# Patient Record
Sex: Male | Born: 1990 | Race: Black or African American | Hispanic: No | Marital: Single | Smoking: Never smoker
Health system: Southern US, Community
[De-identification: ages and names within clinical notes are randomized; demographics above are authoritative.]

## PROBLEM LIST (undated history)

## (undated) ENCOUNTER — Emergency Department (HOSPITAL_COMMUNITY): Payer: Managed Care, Other (non HMO)

## (undated) DIAGNOSIS — R55 Syncope and collapse: Secondary | ICD-10-CM

## (undated) DIAGNOSIS — R9431 Abnormal electrocardiogram [ECG] [EKG]: Secondary | ICD-10-CM

## (undated) HISTORY — DX: Abnormal electrocardiogram (ECG) (EKG): R94.31

## (undated) HISTORY — DX: Syncope and collapse: R55

---
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
## 2013-01-31 ENCOUNTER — Encounter: Payer: Self-pay | Admitting: *Deleted

## 2013-02-01 ENCOUNTER — Encounter: Payer: Self-pay | Admitting: Cardiovascular Disease

## 2013-02-01 ENCOUNTER — Ambulatory Visit (INDEPENDENT_AMBULATORY_CARE_PROVIDER_SITE_OTHER): Payer: Federal, State, Local not specified - PPO | Admitting: Cardiovascular Disease

## 2013-02-01 VITALS — BP 116/68 | HR 58 | Ht 75.0 in | Wt 191.0 lb

## 2013-02-01 DIAGNOSIS — R55 Syncope and collapse: Secondary | ICD-10-CM

## 2013-02-01 DIAGNOSIS — R079 Chest pain, unspecified: Secondary | ICD-10-CM | POA: Insufficient documentation

## 2013-02-01 DIAGNOSIS — R9431 Abnormal electrocardiogram [ECG] [EKG]: Secondary | ICD-10-CM | POA: Insufficient documentation

## 2013-02-01 NOTE — Assessment & Plan Note (Signed)
Somewhat concerning to have young athlete with chest pain and pre syncope playing basketball.  See above regarding need for echo [facility] cardiac CT to r/o coronary anomaly as his presyncope was associated with both dyspnea and chest pain.

## 2013-02-01 NOTE — Assessment & Plan Note (Signed)
Etiology of symptoms not clear  No high risk family history ECG ok and exam ok  F/U echo to r/o HOCM, or RV abnormality

## 2013-02-01 NOTE — Patient Instructions (Addendum)
Your physician has requested that you have an echocardiogram. Echocardiography is a painless test that uses sound waves to create images of your heart. It provides your doctor with information about the size and shape of your heart and how well your heart's chambers and valves are working. This procedure takes approximately one hour. There are no restrictions for this procedure.  Your physician has requested that you have cardiac CTA. Cardiac computed tomography (CT) is a painless test that uses an x-ray machine to take clear, detailed pictures of your heart. For further information please visit [email or web address]. Please follow instruction sheet as given.

## 2013-02-01 NOTE — Progress Notes (Signed)
Patient ID: Troy Li, male   DOB: June 09, 1990, 23 y.o.   MRN: [id number]   23 yo referred by Dr Kenna GilbertMorrow Eagle for chest pain and presyncope.  He is a black male who attends [employer].  Studying business.  Plays a lot of basketball  First episode last Summer.  Similar episode earlier this month.  Near faint with SSCP.  Was tired.  Had chest pain while playing and had to sit down.  Had not eaten and played for a long time Some palpitations.  Got water and some fruit with slow recovery.  Also felt hard to get air.  No incidence other than on basketball court.  Only child Mom and dad with no cardiac Issues or sudden death in extended family.  No history of hypercoagulability DVT, PE.  Denies ETOH, drugs, cigarettes or stimulants.  Mother says " he is a good kid"      ROS: Denies fever, malais, weight loss, blurry vision, decreased visual acuity, cough, sputum, SOB, hemoptysis, pleuritic pain, palpitaitons, heartburn, abdominal pain, melena, lower extremity edema, claudication, or rash.  All other systems reviewed and negative   General: Affect appropriate Well developed black male  HEENT: normal Neck supple with no adenopathy JVP normal no bruits no thyromegaly Lungs clear with no wheezing and good diaphragmatic motion Heart:  S1/S2 no murmur,rub, gallop or click PMI normal Abdomen: benighn, BS positve, no tenderness, no AAA no bruit.  No HSM or HJR Distal pulses intact with no bruits No edema Neuro non-focal Skin warm and dry No muscular weakness  Medications No current outpatient prescriptions on file.   No current facility-administered medications for this visit.    Allergies Penicillins  Family History: Family History  Problem Relation Age of Onset  . Fibromyalgia Mother   . Alzheimer's disease Maternal Grandfather     Social History: History   Social History  . Marital Status: Single    Spouse Name: N/A    Number of Children: N/A  . Years of Education: N/A    Occupational History  . Not on file.   Social History Main Topics  . Smoking status: Never Smoker   . Smokeless tobacco: Never Used  . Alcohol Use: No  . Drug Use: No  . Sexual Activity: Not on file   Other Topics Concern  . Not on file   Social History Narrative  . No narrative on file    Electrocardiogram:  SR rate 45 QTc 399 normal QRS and morphology no LVH and no pre excitation  Assessment and Plan

## 2013-02-14 ENCOUNTER — Encounter: Payer: Self-pay | Admitting: Cardiovascular Disease

## 2013-02-21 ENCOUNTER — Ambulatory Visit (HOSPITAL_COMMUNITY): Admission: RE | Admit: 2013-02-21 | Payer: Federal, State, Local not specified - PPO | Source: Ambulatory Visit

## 2013-02-23 ENCOUNTER — Other Ambulatory Visit (HOSPITAL_COMMUNITY): Payer: Federal, State, Local not specified - PPO

## 2013-03-10 ENCOUNTER — Telehealth: Payer: Self-pay | Admitting: Hematology and Oncology

## 2013-03-10 NOTE — Telephone Encounter (Signed)
S/W PTS MOTHER IN REF TO NP APPT 03/23/13@8 :00 REFERRING DR MORROW DX-LEUKOPENIA, NEUTROPENIA MAILED NP PACKET

## 2013-03-11 ENCOUNTER — Telehealth: Payer: Self-pay | Admitting: Hematology and Oncology

## 2013-03-11 NOTE — Telephone Encounter (Signed)
C/D 03/11/13 for appt. 03/23/13

## 2013-03-14 ENCOUNTER — Telehealth: Payer: Self-pay | Admitting: Hematology and Oncology

## 2013-03-14 NOTE — Telephone Encounter (Signed)
due to new schedule block for 3/18 appt moved to PM. new pt appt moved to 1:30pm FC/NP on 3/18. s/w pt he is aware.

## 2013-03-17 ENCOUNTER — Other Ambulatory Visit (HOSPITAL_COMMUNITY): Payer: Federal, State, Local not specified - PPO

## 2013-03-23 ENCOUNTER — Ambulatory Visit: Payer: Federal, State, Local not specified - PPO | Admitting: Hematology and Oncology

## 2013-03-23 ENCOUNTER — Ambulatory Visit: Payer: Federal, State, Local not specified - PPO

## 2013-03-23 ENCOUNTER — Ambulatory Visit (HOSPITAL_COMMUNITY): Payer: Federal, State, Local not specified - PPO | Attending: Cardiovascular Disease | Admitting: Radiology

## 2013-03-23 DIAGNOSIS — R9431 Abnormal electrocardiogram [ECG] [EKG]: Secondary | ICD-10-CM | POA: Insufficient documentation

## 2013-03-23 DIAGNOSIS — R072 Precordial pain: Secondary | ICD-10-CM | POA: Insufficient documentation

## 2013-03-23 DIAGNOSIS — R55 Syncope and collapse: Secondary | ICD-10-CM | POA: Insufficient documentation

## 2013-03-23 NOTE — Progress Notes (Signed)
Echocardiogram performed.  

## 2013-03-31 ENCOUNTER — Other Ambulatory Visit (HOSPITAL_COMMUNITY): Payer: Federal, State, Local not specified - PPO

## 2013-04-04 ENCOUNTER — Encounter: Payer: Self-pay | Admitting: Cardiovascular Disease

## 2013-04-04 ENCOUNTER — Other Ambulatory Visit: Payer: Self-pay | Admitting: *Deleted

## 2013-04-04 DIAGNOSIS — R001 Bradycardia, unspecified: Secondary | ICD-10-CM

## 2013-04-05 ENCOUNTER — Encounter: Payer: Self-pay | Admitting: Cardiovascular Disease

## 2013-04-20 ENCOUNTER — Telehealth (HOSPITAL_COMMUNITY): Payer: Self-pay

## 2013-04-22 ENCOUNTER — Ambulatory Visit (HOSPITAL_COMMUNITY): Payer: Federal, State, Local not specified - PPO

## 2013-04-22 ENCOUNTER — Encounter (HOSPITAL_COMMUNITY): Payer: Federal, State, Local not specified - PPO

## 2013-04-22 NOTE — Telephone Encounter (Signed)
New message    Patient called to cancel his procedure at [facility].

## 2013-04-22 NOTE — Telephone Encounter (Signed)
Received message that pt cancelled his treadmill scheduled for [facility] today.  Called pt who was very groggy (sounded like he was asleep) to see if he wanted to reschedule.  He states he did but would call back later. Will route to Dr. Eden EmmsNishan to make him aware.

## 2013-05-06 ENCOUNTER — Telehealth (HOSPITAL_COMMUNITY): Payer: Self-pay

## 2013-05-11 ENCOUNTER — Inpatient Hospital Stay (HOSPITAL_COMMUNITY): Admission: RE | Admit: 2013-05-11 | Payer: Federal, State, Local not specified - PPO | Source: Ambulatory Visit

## 2013-07-14 NOTE — Telephone Encounter (Signed)
Encounter complete. 

## 2014-06-08 ENCOUNTER — Emergency Department (HOSPITAL_COMMUNITY)
Admission: EM | Admit: 2014-06-08 | Discharge: 2014-06-09 | Disposition: A | Discharge status: Home / Self Care | Payer: Federal, State, Local not specified - PPO | Attending: Emergency Medicine | Admitting: Emergency Medicine

## 2014-06-08 ENCOUNTER — Encounter (HOSPITAL_COMMUNITY): Payer: Self-pay | Admitting: Nurse Practitioner

## 2014-06-08 DIAGNOSIS — Y998 Other external cause status: Secondary | ICD-10-CM | POA: Diagnosis not present

## 2014-06-08 DIAGNOSIS — S99921A Unspecified injury of right foot, initial encounter: Secondary | ICD-10-CM | POA: Diagnosis present

## 2014-06-08 DIAGNOSIS — Z880 Allergy status to penicillin: Secondary | ICD-10-CM | POA: Insufficient documentation

## 2014-06-08 DIAGNOSIS — W1831XA Fall on same level due to stepping on an object, initial encounter: Secondary | ICD-10-CM | POA: Insufficient documentation

## 2014-06-08 DIAGNOSIS — Y9367 Activity, basketball: Secondary | ICD-10-CM | POA: Insufficient documentation

## 2014-06-08 DIAGNOSIS — Y92310 Basketball court as the place of occurrence of the external cause: Secondary | ICD-10-CM | POA: Insufficient documentation

## 2014-06-08 DIAGNOSIS — S90111A Contusion of right great toe without damage to nail, initial encounter: Secondary | ICD-10-CM

## 2014-06-08 NOTE — ED Provider Notes (Signed)
CSN: [id number]     Arrival date & time 06/08/14  2319 History  This chart was scribed for non-physician practitioner Marlon Pel, PA-C working with Shon Baton, MD by Annye Asa, ED Scribe. This patient was seen in room WTR8/WTR8 and the patient's care was started at 11:47 PM.    Chief Complaint  Patient presents with  . Toe Pain   The history is provided by the patient. No language interpreter was used.     HPI Comments: Troy Li is a 24 y.o. male who presents to the Emergency Department complaining of right great toe pain after being stepped on during a pick up basketball game this evening around 21:45. Patient states someone stepped on his toe, he fell to the ground, and had another person step on his toe. He has difficulty ambulating due to pain; his pain is exacerbated with applied pressure, movement. He currently rates his pain as 4/10 after taking an antiinflammatory PTA. He is unable to specify this medication further. Denies any other injuries. No loc, head or neck injury.  Past Medical History  Diagnosis Date  . Pre-syncope   . Abnormal EKG    History reviewed. No pertinent past surgical history. Family History  Problem Relation Age of Onset  . Fibromyalgia Mother   . Alzheimer's disease Maternal Grandfather    History  Substance Use Topics  . Smoking status: Never Smoker   . Smokeless tobacco: Never Used  . Alcohol Use: No    Review of Systems  Constitutional: Negative for fever and chills.  Respiratory: Negative for cough.   Musculoskeletal:       Right great toe pain  All other systems reviewed and are negative.   Allergies  Penicillins  Home Medications   Prior to Admission medications   Not on File   BP 131/74 mmHg  Pulse 59  Temp(Src) 97.6 F (36.4 C) (Oral)  Resp 14  Ht  (1.905 m)  Wt 197 lb (89.359 kg)  BMI 24.62 kg/m2  SpO2 100% Physical Exam  Constitutional: He is oriented to person, place, and time. He appears  well-developed and well-nourished.  HENT:  Head: Normocephalic and atraumatic.  Neck: No tracheal deviation present.  Cardiovascular: Normal rate.   Pulmonary/Chest: Effort normal.  Musculoskeletal: He exhibits tenderness.  Right great toe; TTP, no obvious deformities, no rashes or skin disruptions. Pain with ROM. Cap refill < 2 secs, sensation intact.   Neurological: He is alert and oriented to person, place, and time.  Skin: Skin is warm and dry.  Psychiatric: He has a normal mood and affect. His behavior is normal.  Nursing note and vitals reviewed.   ED Course  Procedures   DIAGNOSTIC STUDIES: Oxygen Saturation is 100% on RA, normal by my interpretation.    COORDINATION OF CARE: 11:51 PM Discussed treatment plan with pt at bedside and pt agreed to plan. Patients xray does not show any fracture. Will give post op boot, pt declines crutches. Recommend ice,rest and elevation. Referral to Ortho if symptoms are not improving recommend NSAIDs    Labs Review Labs Reviewed - No data to display  Imaging Review No results found.   EKG Interpretation None      MDM   Final diagnoses:  Contusion of great toe, right, initial encounter    Medications - No data to display  24 y.o.[name]'s evaluation in the Emergency Department is complete. It has been determined that no acute conditions requiring further emergency intervention are  present at this time. The patient/guardian have been advised of the diagnosis and plan. We have discussed signs and symptoms that warrant return to the ED, such as changes or worsening in symptoms.  Vital signs are stable at discharge. Filed Vitals:   06/08/14 2334  BP: 131/74  Pulse: 59  Temp: 97.6 F (36.4 C)  Resp: 14    Patient/guardian has voiced understanding and agreed to follow-up with the PCP or specialist.  I personally performed the services described in this documentation, which was scribed in my presence. The recorded  information has been reviewed and is accurate.      Marlon Peliffany Rifka Ramey, PA-C 06/09/14 [id number]  Shon Batonourtney F Horton, MD 06/09/14 [phone]

## 2014-06-08 NOTE — ED Notes (Signed)
Pt c/o of right great toe pain secondary to being stepped on while playing basketball. Pain 4/10, took an unknown pain pill PTA

## 2014-06-09 ENCOUNTER — Emergency Department (HOSPITAL_COMMUNITY): Payer: Federal, State, Local not specified - PPO

## 2014-06-09 NOTE — Discharge Instructions (Signed)
Contusion °A contusion is a deep bruise. Contusions are the result of an injury that caused bleeding under the skin. The contusion may turn blue, purple, or yellow. Minor injuries will give you a painless contusion, but more severe contusions may stay painful and swollen for a few weeks.  °CAUSES  °A contusion is usually caused by a blow, trauma, or direct force to an area of the body. °SYMPTOMS  °· Swelling and redness of the injured area. °· Bruising of the injured area. °· Tenderness and soreness of the injured area. °· Pain. °DIAGNOSIS  °The diagnosis can be made by taking a history and physical exam. An X-ray, CT scan, or MRI may be needed to determine if there were any associated injuries, such as fractures. °TREATMENT  °Specific treatment will depend on what area of the body was injured. In general, the best treatment for a contusion is resting, icing, elevating, and applying cold compresses to the injured area. Over-the-counter medicines may also be recommended for pain control. Ask your caregiver what the best treatment is for your contusion. °HOME CARE INSTRUCTIONS  °· Put ice on the injured area. °¨ Put ice in a plastic bag. °¨ Place a towel between your skin and the bag. °¨ Leave the ice on for 15-20 minutes, 3-4 times a day, or as directed by your health care provider. °· Only take over-the-counter or prescription medicines for pain, discomfort, or fever as directed by your caregiver. Your caregiver may recommend avoiding anti-inflammatory medicines (aspirin, ibuprofen, and naproxen) for 48 hours because these medicines may increase bruising. °· Rest the injured area. °· If possible, elevate the injured area to reduce swelling. °SEEK IMMEDIATE MEDICAL CARE IF:  °· You have increased bruising or swelling. °· You have pain that is getting worse. °· Your swelling or pain is not relieved with medicines. °MAKE SURE YOU:  °· Understand these instructions. °· Will watch your condition. °· Will get help right  away if you are not doing well or get worse. °Document Released: 10/02/2004 Document Revised: 12/28/2012 Document Reviewed: 10/28/2010 °ExitCare® Patient Information ©2015 [employer]. This information is not intended to replace advice given to you by your health care provider. Make sure you discuss any questions you have with your health care provider. ° °

## 2014-11-04 ENCOUNTER — Emergency Department (HOSPITAL_COMMUNITY)
Admission: EM | Admit: 2014-11-04 | Discharge: 2014-11-04 | Disposition: A | Discharge status: Home / Self Care | Payer: Federal, State, Local not specified - PPO | Attending: Emergency Medicine | Admitting: Emergency Medicine

## 2014-11-04 ENCOUNTER — Encounter (HOSPITAL_COMMUNITY): Payer: Self-pay | Admitting: *Deleted

## 2014-11-04 DIAGNOSIS — T7840XA Allergy, unspecified, initial encounter: Secondary | ICD-10-CM | POA: Diagnosis not present

## 2014-11-04 DIAGNOSIS — R21 Rash and other nonspecific skin eruption: Secondary | ICD-10-CM | POA: Diagnosis present

## 2014-11-04 DIAGNOSIS — R600 Localized edema: Secondary | ICD-10-CM | POA: Insufficient documentation

## 2014-11-04 DIAGNOSIS — X58XXXA Exposure to other specified factors, initial encounter: Secondary | ICD-10-CM | POA: Diagnosis not present

## 2014-11-04 DIAGNOSIS — Y9289 Other specified places as the place of occurrence of the external cause: Secondary | ICD-10-CM | POA: Insufficient documentation

## 2014-11-04 DIAGNOSIS — L298 Other pruritus: Secondary | ICD-10-CM | POA: Diagnosis not present

## 2014-11-04 DIAGNOSIS — Z79899 Other long term (current) drug therapy: Secondary | ICD-10-CM | POA: Insufficient documentation

## 2014-11-04 DIAGNOSIS — Y9389 Activity, other specified: Secondary | ICD-10-CM | POA: Insufficient documentation

## 2014-11-04 DIAGNOSIS — Y998 Other external cause status: Secondary | ICD-10-CM | POA: Insufficient documentation

## 2014-11-04 LAB — RAPID STREP SCREEN (MED CTR MEBANE ONLY): STREPTOCOCCUS, GROUP A SCREEN (DIRECT): NEGATIVE

## 2014-11-04 MED ORDER — FAMOTIDINE 20 MG PO TABS
20.0000 mg | ORAL_TABLET | Freq: Once | ORAL | Status: AC
  Administered 2014-11-04: 20 mg via ORAL
  Filled 2014-11-04: qty 1

## 2014-11-04 MED ORDER — EPINEPHRINE 0.3 MG/0.3ML IJ SOAJ: 0.3000 mg | Freq: Once | INTRAMUSCULAR | Status: DC

## 2014-11-04 MED ORDER — DIPHENHYDRAMINE HCL 25 MG PO CAPS
25.0000 mg | ORAL_CAPSULE | Freq: Once | ORAL | Status: AC
  Administered 2014-11-04: 25 mg via ORAL
  Filled 2014-11-04: qty 1

## 2014-11-04 MED ORDER — EPINEPHRINE 0.3 MG/0.3ML IJ SOAJ: 0.3000 mg | Freq: Once | INTRAMUSCULAR | Status: AC

## 2014-11-04 MED ORDER — PREDNISONE 10 MG (21) PO TBPK: 10.0000 mg | ORAL_TABLET | Freq: Every day | ORAL | Status: AC

## 2014-11-04 MED ORDER — PREDNISONE 50 MG PO TABS
50.0000 mg | ORAL_TABLET | Freq: Once | ORAL | Status: AC
  Administered 2014-11-04: 50 mg via ORAL
  Filled 2014-11-04: qty 1

## 2014-11-04 NOTE — ED Notes (Addendum)
Pt reports he ate shrimp yesterday 10/28 at 1230, pt denies food allergies. Reports he woke up today with mouth swelling and all over body itching. Legs no longer itch, now arms itching. Has hives to bilateral forearms. Pt reports foot and hand pain. Bottom of feet red and palms of hands red. Pain 2/10. Pt had unprotected sex with gf yesterday. Denies SOB. Able to speak in full sentences.  Pt reports he has had a virus for 2 weeks now, lost his voice x3 days, sore throat, productive cough.

## 2014-11-04 NOTE — ED Provider Notes (Signed)
CSN: [id number]     Arrival date & time 11/04/14  1127 History   First MD Initiated Contact with Patient 11/04/14 1206     Chief Complaint  Patient presents with  . Pruritis     (Consider location/radiation/quality/duration/timing/severity/associated sxs/prior Treatment) The history is provided by the patient and a parent. No language interpreter was used.   Mr. Yetta BarreJones is a 24 year old male who presents for itching all over his body and swelling to his lips when he woke up today. He states that his palms and soles burn. He is also complaining of a rash on his buttocks, bilateral upper extremities. He is also complaining of a sore throat for the past 2 weeks and states he lost his voice for 3 days but is since regained it. He states he ate shrimp and fries and cookout before going to bed last night. He states it couldn't have been the shrimp because eats that all the time. He denies taking anything for it. Mom showed me a picture from when the patient awoke this morning and he has significant swelling to upper and lower lips.  He denies any fever, chills, cough, shortness of breath, throat swelling, difficulty breathing, chest pain, abdominal pain, nausea, vomiting. Past Medical History  Diagnosis Date  . Pre-syncope   . Abnormal EKG    History reviewed. No pertinent past surgical history. Family History  Problem Relation Age of Onset  . Fibromyalgia Mother   . Alzheimer's disease Maternal Grandfather    Social History  Substance Use Topics  . Smoking status: Never Smoker   . Smokeless tobacco: Never Used  . Alcohol Use: No    Review of Systems  Constitutional: Negative for fever and chills.  Respiratory: Negative for cough, shortness of breath and wheezing.   Skin: Positive for wound.  All other systems reviewed and are negative.     Allergies  Penicillins  Home Medications   Prior to Admission medications   Medication Sig Start Date End Date Taking? Authorizing  Provider  dextromethorphan-guaiFENesin (MUCINEX DM) 30-600 MG 12hr tablet Take 1 tablet by mouth 2 (two) times daily as needed for cough.   Yes Historical Provider, MD  Phenylephrine-DM-GG-APAP (TYLENOL COLD/FLU SEVERE) 5-10-200-325 MG TABS Take 2 tablets by mouth once.   Yes Historical Provider, MD  Phenylephrine-Pheniramine-DM [facility](THERAFLU COLD & COUGH) 10-26-18 MG PACK Take 1 packet by mouth once.   Yes Historical Provider, MD  EPINEPHrine 0.3 mg/0.3 mL IJ SOAJ injection Inject 0.3 mLs (0.3 mg total) into the muscle once. 11/04/14   Marlane Hirschmann Patel-Mills, PA-C  predniSONE (STERAPRED UNI-PAK 21 TAB) 10 MG (21) TBPK tablet Take 1 tablet (10 mg total) by mouth daily. Take 5 tabs for 2 days, then 4 tabs for 2 days, then 3 tabs for 2 days, 2 tabs for 2 days, then 1 tab by mouth daily for 2 days 11/04/14   Lorelle FormosaHanna Patel-Mills, PA-C   BP 135/62 mmHg  Pulse 115  Temp(Src) 97.8 F (36.6 C) (Oral)  Resp 18  SpO2 100% Physical Exam  Constitutional: He is oriented to person, place, and time. He appears well-developed and well-nourished. No distress.  HENT:  Head: Normocephalic and atraumatic.  No throat swelling. Uvula is midline. Patent airway with no respiratory distress. No drooling. Well-appearing. No tongue swelling.  Mild upper lip swelling.  Eyes: Conjunctivae are normal.  Neck: Normal range of motion. Neck supple.  Cardiovascular: Normal rate, regular rhythm and normal heart sounds.   Pulmonary/Chest: Effort normal and breath sounds normal. No  respiratory distress. He has no wheezes. He has no rales.  Lungs clear to auscultation bilaterally. No wheezing or decreased breath sounds.  Abdominal: Soft. There is no tenderness.  Musculoskeletal: Normal range of motion.  Neurological: He is alert and oriented to person, place, and time.  Skin: Skin is warm, dry and intact.  No rash can be over the buttocks, lower extremities, or upper extremities.  Nursing note and vitals reviewed.   ED Course   Procedures (including critical care time) Labs Review Labs Reviewed  RAPID STREP SCREEN (NOT AT [facility])  CULTURE, GROUP A STREP    Imaging Review No results found.   EKG Interpretation None      MDM   Final diagnoses:  Allergic reaction, initial encounter   Patient presents for rash and lip swelling and a sore throat 2 weeks. I was able to see a picture of him from this morning and he had significant swelling to the lips. At no point did he have respiratory distress or feel that his throat was closing. He looks much better than the picture. He has no rash. His vital signs are stable. Medications  diphenhydrAMINE (BENADRYL) capsule 25 mg (25 mg Oral Given 11/04/14 1300)  famotidine (PEPCID) tablet 20 mg (20 mg Oral Given 11/04/14 1300)  predniSONE (DELTASONE) tablet 50 mg (50 mg Oral Given 11/04/14 1300)   Strep is negative. I also prescribed an epipen and prednisone.  I discussed avoiding seafood until further testing by his physician.  He can also take benadryl if he feels that this is happening again in the future. I discussed return precautions with the patient as well as follow-up and he verbally agrees with the plan.      Catha Gosselin, PA-C 11/04/14 1457  Elwin Mocha, MD 11/04/14 [phone]

## 2014-11-04 NOTE — Discharge Instructions (Signed)
Epinephrine Injection Epinephrine is a medicine given by injection to temporarily treat an emergency allergic reaction. It is also used to treat severe asthmatic attacks and other lung problems. The medicine helps to enlarge (dilate) the small breathing tubes of the lungs. A life-threatening, sudden allergic reaction that involves the whole body is called anaphylaxis. Because of potential side effects, epinephrine should only be used as directed by your caregiver. RISKS AND COMPLICATIONS Possible side effects of epinephrine injections include:  Chest pain.  Irregular or rapid heartbeat.  Shortness of breath.  Nausea.  Vomiting.  Abdominal pain or cramping.  Sweating.  Dizziness.  Weakness.  Headache.  Nervousness. Report all side effects to your caregiver. HOW TO GIVE AN EPINEPHRINE INJECTION Give the epinephrine injection immediately when symptoms of a severe reaction begin. Inject the medicine into the outer thigh or any available, large muscle. Your caregiver can teach you how to do this. You do not need to remove any clothing. After the injection, call your local emergency services (911 in U.S.). Even if you improve after the injection, you need to be examined at a hospital emergency department. Epinephrine works quickly, but it also wears off quickly. Delayed reactions can occur. A delayed reaction may be as serious and dangerous as the initial reaction. HOME CARE INSTRUCTIONS  Make sure you and your family know how to give an epinephrine injection.  Use epinephrine injections as directed by your caregiver. Do not use this medicine more often or in larger doses than prescribed.  Always carry your epinephrine injection or anaphylaxis kit with you. This can be lifesaving if you have a severe reaction.  Store the medicine in a cool, dry place. If the medicine becomes discolored or cloudy, dispose of it properly and replace it with new medicine.  Check the expiration date on  your medicine. It may be unsafe to use medicines past their expiration date.  Tell your caregiver about any other medicines you are taking. Some medicines can react badly with epinephrine.  Tell your caregiver about any medical conditions you have, such as diabetes, high blood pressure (hypertension), heart disease, irregular heartbeats, or if you are pregnant. SEEK IMMEDIATE MEDICAL CARE IF:  You have used an epinephrine injection. Call your local emergency services (911 in U.S[place] Even if you improve after the injection, you need to be examined at a hospital emergency department to make sure your allergic reaction is under control. You will also be monitored for adverse effects from the medicine.  You have chest pain.  You have irregular or fast heartbeats.  You have shortness of breath.  You have severe headaches.  You have severe nausea, vomiting, or abdominal cramps.  You have severe pain, swelling, or redness in the area where you gave the injection.   This information is not intended to replace advice given to you by your health care provider. Make sure you discuss any questions you have with your health care provider.   Document Released: 12/21/1999 Document Revised: 03/17/2011 Document Reviewed: 07/12/2014 Elsevier Interactive Patient Education 2016 [employer].  Emergency Department Resource Guide 1) Find a Doctor and Pay Out of Pocket Although you won't have to find out who is covered by your insurance plan, it is a good idea to ask around and get recommendations. You will then need to call the office and see if the doctor you have chosen will accept you as a new patient and what types of options they offer for patients who are self-pay. Some doctors offer  discounts or will set up payment plans for their patients who do not have insurance, but you will need to ask so you aren't surprised when you get to your appointment.  2) Contact Your Local Health Department Not all  health departments have doctors that can see patients for sick visits, but many do, so it is worth a call to see if yours does. If you don't know where your local health department is, you can check in your phone book. The CDC also has a tool to help you locate your state's health department, and many state websites also have listings of all of their local health departments.  3) Find a [facility] If your illness is not likely to be very severe or complicated, you may want to try a walk in clinic. These are popping up all over the country in pharmacies, drugstores, and shopping centers. They're usually staffed by nurse practitioners or physician assistants that have been trained to treat common illnesses and complaints. They're usually fairly quick and inexpensive. However, if you have serious medical issues or chronic medical problems, these are probably not your best option.  No Primary Care Doctor: - Call Health Connect at  [phone] - they can help you locate a primary care doctor that  accepts your insurance, provides certain services, etc. - Physician Referral Service- [phone]  Chronic Pain Problems: Organization         Address  Phone   Notes  [facility]  [phone] Patients need to be referred by their primary care doctor.   Medication Assistance: Organization         Address  Phone   Notes  [facility] Medication [facility] [address] [place], [place] [address] [phone] --Must be a resident of [facility] -- Must have NO insurance coverage whatsoever (no Medicaid/ Medicare, etc.) -- The pt. MUST have a primary care doctor that directs their care regularly and follows them in the community   [facility]  [phone]   [employer]  [phone]    Agencies that provide inexpensive medical care: Organization         Address  Phone   Notes  [place]  [phone]   [facility]     [phone]   [facility] [place], [place] [address] [phone]   [place] [address], [place] [phone]   Planned Parenthood    [phone]   [facility]    [phone]   [place] and [place] [address], [place] Phone:  [phone], Fax:  [phone] Hours of Operation:  9 am - 6 pm, M-F.  Also accepts Medicaid/Medicare and self-pay.  [facility] for [place], [address], [place] Phone: [phone], Fax: [phone]. Hours of Operation:  8:30 am - 5:30 pm, M-F.  Also accepts Medicaid and self-pay.  [facility] [place] [address], [place] Phone: [phone]   [place], [place], [place] [phone], Ext. [phone] Mondays & Thursdays: 7-9 AM.  First 15 patients are seen on a first come, first serve basis.    [place] Providers:  Organization         Address  Phone   Notes  [facility] [address],  [place] [phone] Also accepts self-pay patients.  [facility] [address], [place]  [phone]   [place], [address], [place] [phone]   [facility] [address], [place] [phone]   Lucianne Lei [address], [place]   [phone] Only accepts [place] Access [place] patients after they have their name applied to their card.   Self-Pay (no insurance) in [facility]:  Organization         Address  Phone   Notes  Sickle Cell Patients, [facility] [place] [phone]   [facility] [place] [phone]   [facility] [place]  [place], [place], [place] [phone]     [facility]/Dr. Osei-Bonsu  [address], [place] or [address], [place] [phone] Phone number for both [place] and [place] locations is the same.  [facility]gent [facility] [address] [place] [phone]   [facility] [address] [place] or [address] [phone]   [facility] [address] [place] [phone], phone; [phone], fax Sees patients 1st and 3rd Saturday of every month.  Must not qualify for public or private insurance (i.e. Medicaid, Medicare, [place] Health Choice, Veterans' Benefits)  Household income should be no more than 200% of the poverty level The clinic cannot treat you if you are pregnant or think you are pregnant  Sexually transmitted diseases are not treated at the clinic.    Dental Care: Organization         Address  Phone  Notes  [facility] [place] [phone] Accepts children up to age 61 who are enrolled in [place] or [place]; pregnant women with a Medicaid card; and children who have applied for Medicaid or [place] Health Choice, but were declined, whose parents can pay a reduced fee at time of service.  [facility] Department of [facility]  [address], [place] [phone] Accepts children up to age 38 who are enrolled in [place] or [place]; pregnant women with a Medicaid card; and children who have applied for Medicaid or [place] Health Choice, but were declined, whose parents can pay a reduced fee at time of service.  [place] Adult [facility] Access PROGRAM  [place] [phone] Patients are seen by appointment only. Walk-ins are not accepted. [place] will see patients 15 years of age and older. Monday - Tuesday (8am-5pm) Most Wednesdays (8:30-5pm) $30 per visit,  cash only  [facility] Adult Dental Access PROGRAM  [address], [facility] [phone] Patients are seen by appointment only. Walk-ins are not accepted. [place] will see patients 22 years of age and older. One Wednesday Evening (Monthly: Volunteer Based).  $30 per visit, cash only  [place]  [phone] for adults; Children under age 17, call Graduate Pediatric Dentistry at [phone]. Children aged 85-14, please call [phone] to request a pediatric application.  Dental services are provided in all areas of dental care including fillings, crowns and bridges, complete and partial dentures, implants, gum treatment, root canals, and extractions. Preventive care  is also provided. Treatment is provided to both adults and children. Patients are selected via a lottery and there is often a waiting list.   [facility] [address], [place]  [phone] [email or web address]   [facility] [address] [place], [place] [phone], Ext. [phone] Second and Fourth Thursday of each month, opens at 6:30 AM; Clinic ends at 9 AM.  Patients are seen on a first-come first-served basis, and a limited number are seen during each clinic.   [facility]  [address] Hillard Danker [place], [place] [phone]   Eligibility Requirements You must have lived in [place], [place], or [place] counties for at least the last three months.   You cannot be eligible for state or federal sponsored [employer], including [employer], [place], or [employer].   You generally cannot be eligible for healthcare insurance through your employer.    How to apply: Eligibility screenings are held every Tuesday and Wednesday afternoon from 1:00 pm until 4:00 pm. You do not need an appointment for the interview!  [facility] [address] [place], [place]   [place]   [place] Department  [place]  [phone]    Behavioral Health Resources in the Community: Intensive Outpatient Programs Organization         Address  Phone  Notes  [place]. [address] [place], [place] [phone]   [facility] [address] [place], [place]   ADS: [facility] [address], [place], [place]   [place] [address]  [place], [place] or [phone]   Substance Abuse Resources Organization         Address  Phone  Notes  Al[facility] Drug Services  [phone]   [place]  [phone]   [facility] [place]   Chinita Pester  [phone]   Residential & Outpatient Substance Abuse Program  [phone]   Psychological Services Organization         Address  Phone  Notes  [facility] [place]  [place]  [phone]   [place] [address] [place] or [phone]    Mobile Crisis Teams Organization         Address  Phone  Notes  [facility], Mobile Crisis Care Unit  [phone]   [facility]  [address] [place], [place]   [place] [address] [place] [phone]    Self-Help/Support Groups Organization         Address  Phone             Notes  [place]. of [place] - variety of support groups  [place] Call for more information  Narcotics Anonymous (NA), [facility] [address], [employer] [place]  2 meetings at this location   Special educational needs teacher         Address  Phone  Notes  [facility] Residential Treatment [place],    [place]  [phone]   [facility]  [address] [place] [id number], [place], [place]    [place], [place] [phone] Admissions: 8am-3pm M-F  Incentives Substance [place] [address]  [place], [place]   [facility] [address] Jadene Pierini [place], [place]   [facility] [place].,  [place], [place]   [facility] - Intensive Outpatient [address] Kristeen Mans [address], [place], [place]   [facility] [address]  [place], [place] [phone] or [phone]   [facility] Services ([facility]) [address] [place], [place] Accepts Medicaid  [facility] [place] [address],  [place] [phone] Substance Abuse/Addiction Treatment   [facility] Organization         Address  Phone  Notes  [facility]  [phone]   Domenic Schwab, PhD [address] Arlis Porta [place], [place]   [phone] or [phone]   [facility] [place], [place] [phone]   [facility] [address], [place], [place] [phone] Insurance/Medicaid/sponsorship through [facility] and Families [address] [place]                                    [place], [place] [phone] [place] [address], [place] [phone]    Dr. Adele Schilder  [phone]   [facility] Dept. 1) [address], [place] 2) [place] 3)  [place] [address], [place] [phone]  [phone]   [place] [phone] or [phone] (After Hours)

## 2014-11-06 LAB — CULTURE, GROUP A STREP: Strep A Culture: NEGATIVE

## 2016-01-07 IMAGING — CR DG FOOT COMPLETE 3+V*R*
3 series · 3 of 3 positions shown · non-contrast
Comparison: None.

CLINICAL DATA: Right foot/toe pain after injury. Someone stepped on
patient's foot while playing basketball. Pain in the great toe.

EXAM:
RIGHT FOOT COMPLETE - 3+ VIEW

[x foot ap right]
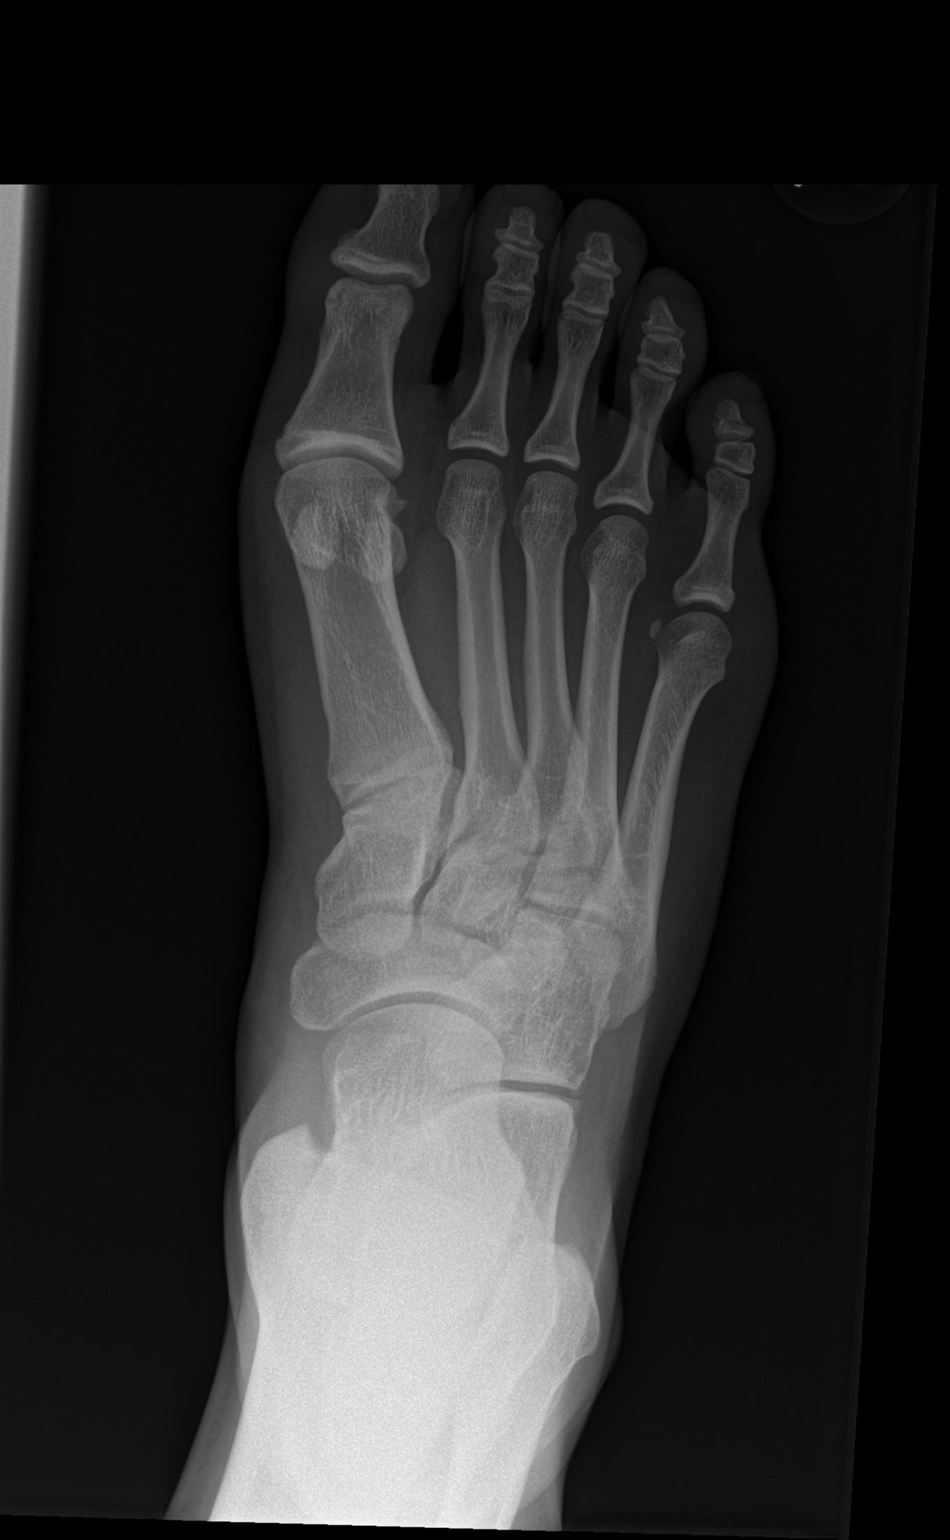

[x foot obl right]
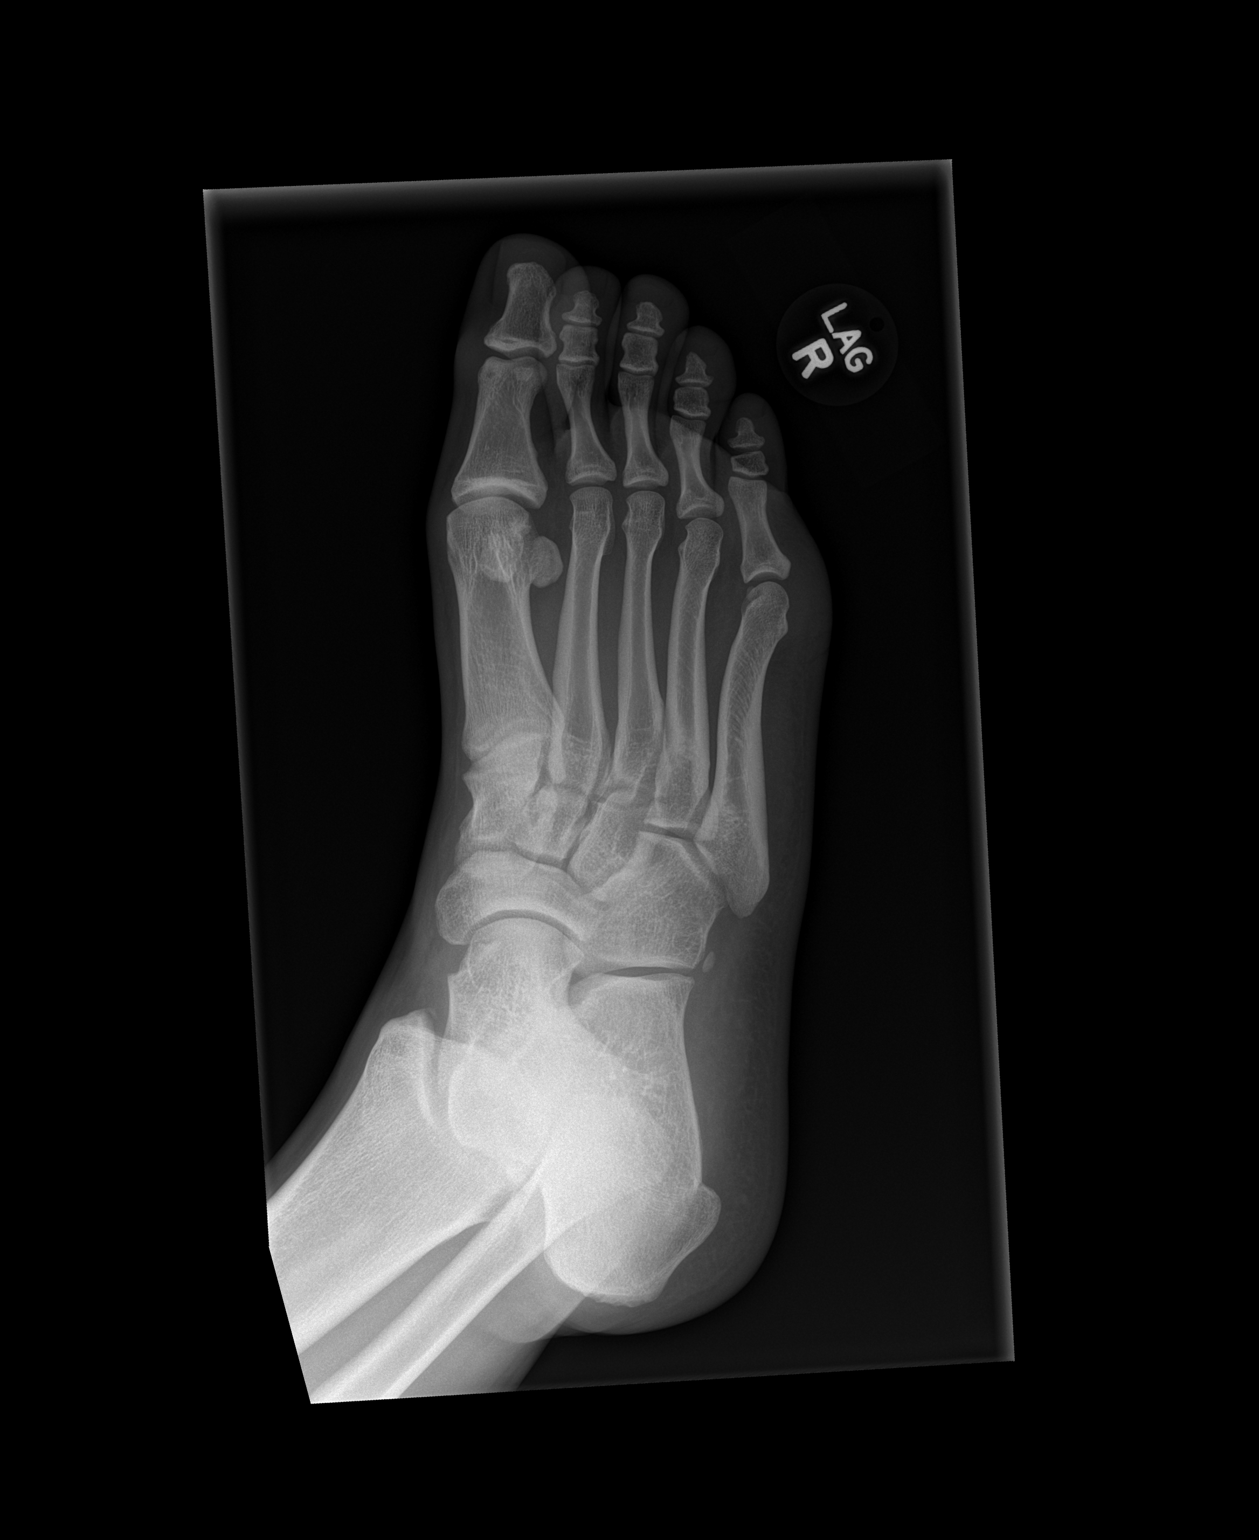

[x foot lat right]
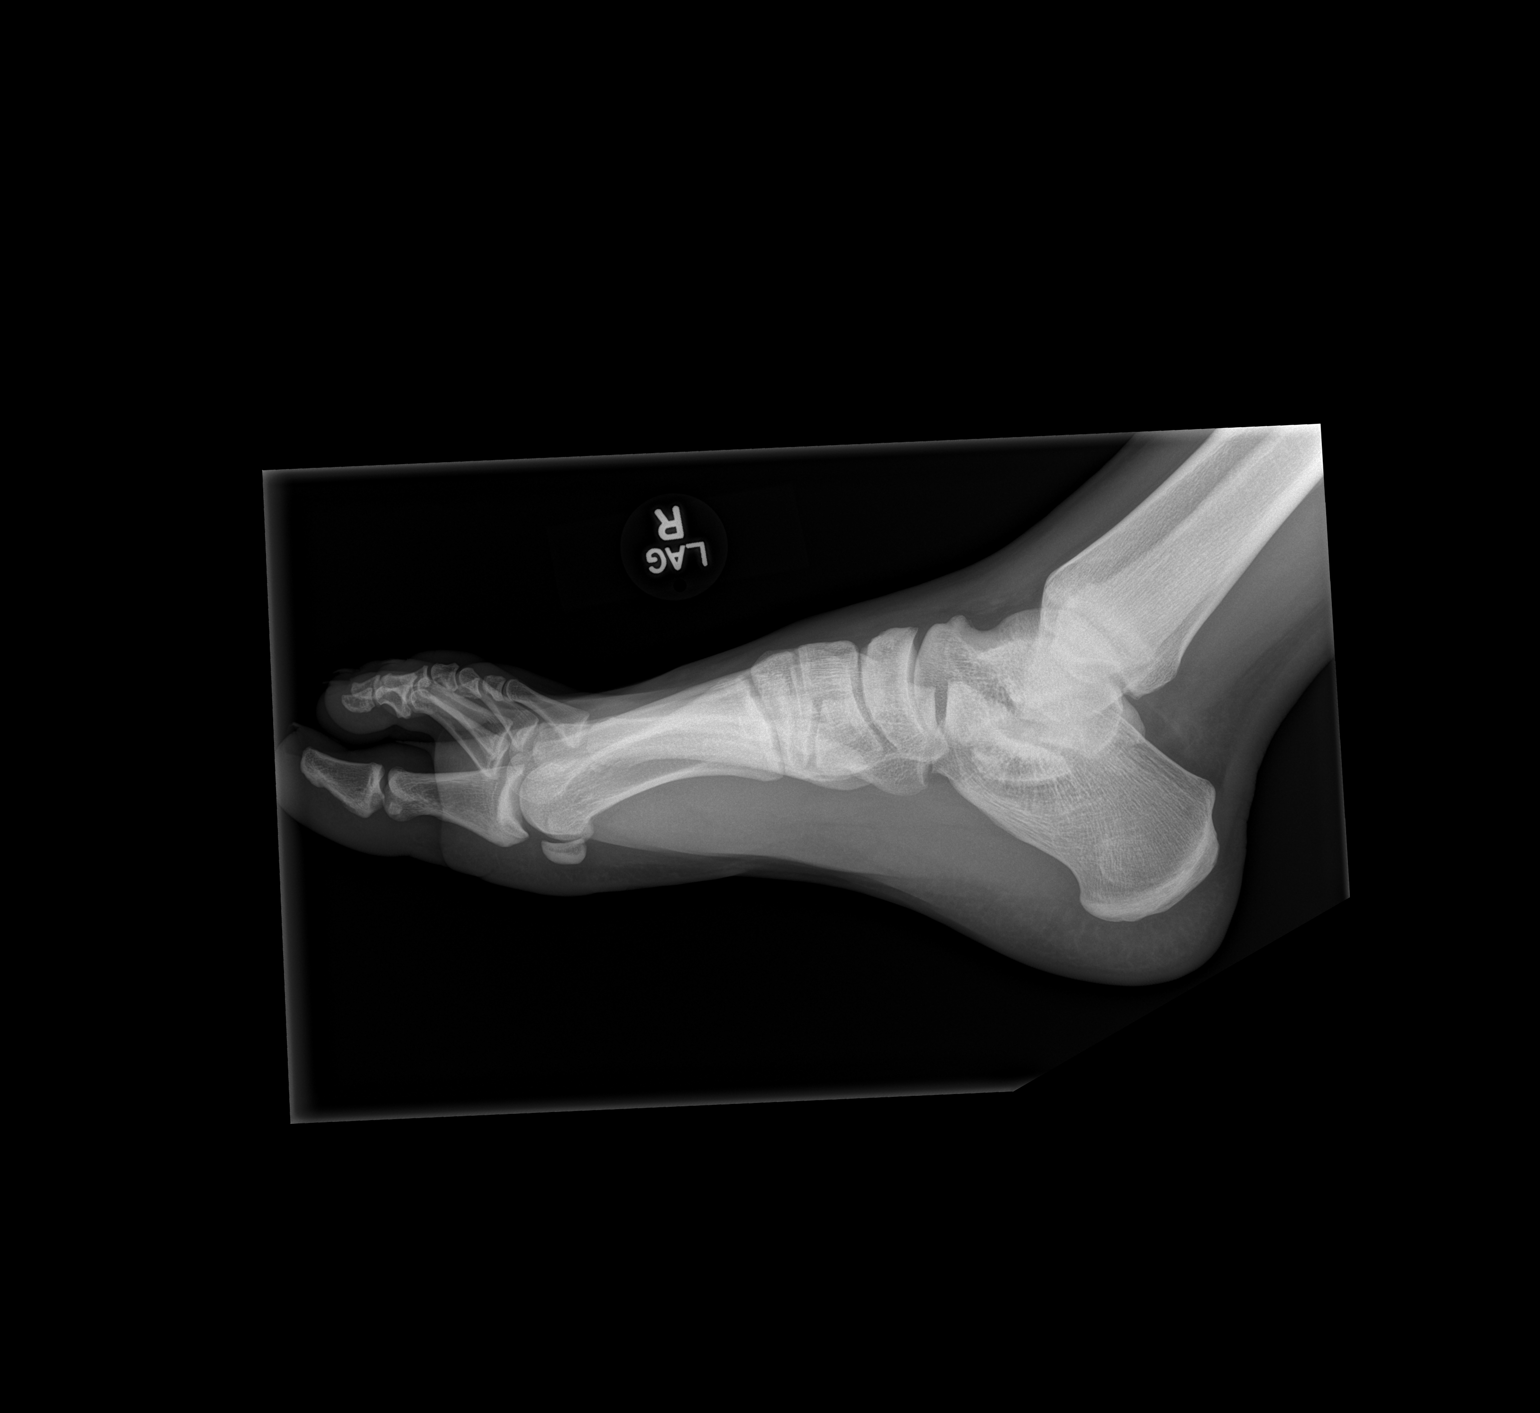

[3 of 3 positions shown; findings below may reference images not displayed]

FINDINGS: No fracture or dislocation. The alignment and joint spaces are
maintained. Particularly, no fracture of the great toe or first ray.
No focal soft tissue abnormality.
IMPRESSION: Negative.

## 2018-12-30 ENCOUNTER — Ambulatory Visit: Payer: Managed Care, Other (non HMO) | Attending: Internal Medicine

## 2018-12-30 DIAGNOSIS — Z20828 Contact with and (suspected) exposure to other viral communicable diseases: Secondary | ICD-10-CM | POA: Insufficient documentation

## 2018-12-30 DIAGNOSIS — Z20822 Contact with and (suspected) exposure to covid-19: Secondary | ICD-10-CM

## 2019-01-01 LAB — NOVEL CORONAVIRUS, NAA: SARS-CoV-2, NAA: NOT DETECTED
